# Patient Record
Sex: Female | Born: 1960 | Race: White | Hispanic: No | Marital: Married | Smoking: Never smoker
Health system: Southern US, Community
[De-identification: ages and names within clinical notes are randomized; demographics above are authoritative.]

## PROBLEM LIST (undated history)

## (undated) DIAGNOSIS — N926 Irregular menstruation, unspecified: Secondary | ICD-10-CM

## (undated) DIAGNOSIS — R51 Headache: Secondary | ICD-10-CM

## (undated) DIAGNOSIS — T7840XA Allergy, unspecified, initial encounter: Secondary | ICD-10-CM

## (undated) DIAGNOSIS — R519 Headache, unspecified: Secondary | ICD-10-CM

## (undated) DIAGNOSIS — F419 Anxiety disorder, unspecified: Secondary | ICD-10-CM

## (undated) HISTORY — PX: OTHER SURGICAL HISTORY: SHX169

---

## 2004-11-24 ENCOUNTER — Ambulatory Visit: Payer: Self-pay

## 2004-11-28 ENCOUNTER — Emergency Department: Payer: Self-pay | Admitting: Internal Medicine

## 2005-11-12 ENCOUNTER — Ambulatory Visit: Payer: Self-pay | Admitting: Obstetrics and Gynecology

## 2005-12-16 ENCOUNTER — Ambulatory Visit: Payer: Self-pay

## 2006-12-19 ENCOUNTER — Ambulatory Visit: Payer: Self-pay

## 2007-12-21 ENCOUNTER — Ambulatory Visit: Payer: Self-pay

## 2010-05-11 ENCOUNTER — Ambulatory Visit: Payer: Self-pay

## 2012-02-16 ENCOUNTER — Ambulatory Visit: Payer: Self-pay | Admitting: Family Medicine

## 2018-03-15 ENCOUNTER — Other Ambulatory Visit: Payer: Self-pay | Admitting: Student

## 2018-03-15 DIAGNOSIS — Z1231 Encounter for screening mammogram for malignant neoplasm of breast: Secondary | ICD-10-CM

## 2018-05-12 ENCOUNTER — Encounter: Payer: Self-pay | Admitting: *Deleted

## 2018-05-15 ENCOUNTER — Ambulatory Visit: Payer: PRIVATE HEALTH INSURANCE | Admitting: Anesthesiology

## 2018-05-15 ENCOUNTER — Encounter
Admission: RE | Disposition: A | Discharge status: Home / Self Care | Payer: Self-pay | Source: Ambulatory Visit | Attending: Unknown Physician Specialty

## 2018-05-15 ENCOUNTER — Ambulatory Visit
Admission: RE | Admit: 2018-05-15 | Discharge: 2018-05-15 | Disposition: A | Discharge status: Home / Self Care | Payer: PRIVATE HEALTH INSURANCE | Source: Ambulatory Visit | Attending: Unknown Physician Specialty | Admitting: Unknown Physician Specialty

## 2018-05-15 DIAGNOSIS — Z8042 Family history of malignant neoplasm of prostate: Secondary | ICD-10-CM | POA: Insufficient documentation

## 2018-05-15 DIAGNOSIS — Z8249 Family history of ischemic heart disease and other diseases of the circulatory system: Secondary | ICD-10-CM | POA: Diagnosis not present

## 2018-05-15 DIAGNOSIS — Z1211 Encounter for screening for malignant neoplasm of colon: Secondary | ICD-10-CM | POA: Diagnosis not present

## 2018-05-15 DIAGNOSIS — K640 First degree hemorrhoids: Secondary | ICD-10-CM | POA: Diagnosis not present

## 2018-05-15 DIAGNOSIS — R51 Headache: Secondary | ICD-10-CM | POA: Insufficient documentation

## 2018-05-15 DIAGNOSIS — D123 Benign neoplasm of transverse colon: Secondary | ICD-10-CM | POA: Insufficient documentation

## 2018-05-15 DIAGNOSIS — Z833 Family history of diabetes mellitus: Secondary | ICD-10-CM | POA: Insufficient documentation

## 2018-05-15 DIAGNOSIS — F419 Anxiety disorder, unspecified: Secondary | ICD-10-CM | POA: Diagnosis not present

## 2018-05-15 HISTORY — DX: Headache: R51

## 2018-05-15 HISTORY — DX: Irregular menstruation, unspecified: N92.6

## 2018-05-15 HISTORY — PX: COLONOSCOPY WITH PROPOFOL: SHX5780

## 2018-05-15 HISTORY — DX: Anxiety disorder, unspecified: F41.9

## 2018-05-15 HISTORY — DX: Allergy, unspecified, initial encounter: T78.40XA

## 2018-05-15 HISTORY — DX: Headache, unspecified: R51.9

## 2018-05-15 SURGERY — COLONOSCOPY WITH PROPOFOL
Anesthesia: General

## 2018-05-15 MED ORDER — SODIUM CHLORIDE 0.9 % IV SOLN: INTRAVENOUS | Status: DC

## 2018-05-15 MED ORDER — MIDAZOLAM HCL 2 MG/2ML IJ SOLN
INTRAMUSCULAR | Status: DC | PRN
  Administered 2018-05-15: 2 mg via INTRAVENOUS

## 2018-05-15 MED ORDER — MIDAZOLAM HCL 2 MG/2ML IJ SOLN
INTRAMUSCULAR | Status: AC
  Filled 2018-05-15: qty 2

## 2018-05-15 MED ORDER — FENTANYL CITRATE (PF) 100 MCG/2ML IJ SOLN
INTRAMUSCULAR | Status: AC
  Filled 2018-05-15: qty 2

## 2018-05-15 MED ORDER — FENTANYL CITRATE (PF) 100 MCG/2ML IJ SOLN
INTRAMUSCULAR | Status: DC | PRN
  Administered 2018-05-15: 50 ug via INTRAVENOUS

## 2018-05-15 MED ORDER — SODIUM CHLORIDE 0.9 % IV SOLN
INTRAVENOUS | Status: DC
  Administered 2018-05-15: 13:00:00 via INTRAVENOUS

## 2018-05-15 MED ORDER — PROPOFOL 500 MG/50ML IV EMUL
INTRAVENOUS | Status: AC
  Filled 2018-05-15: qty 50

## 2018-05-15 MED ORDER — PROPOFOL 500 MG/50ML IV EMUL
INTRAVENOUS | Status: DC | PRN
  Administered 2018-05-15: 120 ug/kg/min via INTRAVENOUS

## 2018-05-15 NOTE — Anesthesia Post-op Follow-up Note (Signed)
Anesthesia QCDR form completed.        

## 2018-05-15 NOTE — Anesthesia Procedure Notes (Signed)
Performed by: Cook-Martin, Aviyanna Colbaugh Pre-anesthesia Checklist: Patient identified, Emergency Drugs available, Suction available and Patient being monitored Patient Re-evaluated:Patient Re-evaluated prior to induction Oxygen Delivery Method: Nasal cannula Preoxygenation: Pre-oxygenation with 100% oxygen Induction Type: IV induction Placement Confirmation: positive ETCO2 and CO2 detector       

## 2018-05-15 NOTE — Transfer of Care (Signed)
Immediate Anesthesia Transfer of Care Note  Patient: Claudia Fritz  Procedure(s) Performed: COLONOSCOPY WITH PROPOFOL (N/A )  Patient Location: Endoscopy Unit  Anesthesia Type:General  Level of Consciousness: awake and alert   Airway & Oxygen Therapy: Patient connected to nasal cannula oxygen  Post-op Assessment: Post -op Vital signs reviewed and stable  Post vital signs: stable  Last Vitals:  Vitals Value Taken Time  BP    Temp    Pulse    Resp    SpO2      Last Pain:  Vitals:   05/15/18 1518  TempSrc: (P) Tympanic  PainSc:          Complications: No apparent anesthesia complications

## 2018-05-15 NOTE — H&P (Signed)
Primary Care Physician:  Ellene Route Primary Gastroenterologist:  Dr. Vira Agar  Pre-Procedure History & Physical: HPI:  Claudia Fritz is a 57 y.o. female is here for an colonoscopy.   Past Medical History:  Diagnosis Date  . Allergy   . Anxiety   . Headache   . Menstrual problem     Past Surgical History:  Procedure Laterality Date  . Graft cartilage nasal septum    . Hysteroscopy with endometrial ablation      Prior to Admission medications   Medication Sig Start Date End Date Taking? Authorizing Provider  Multiple Vitamins-Minerals (WOMENS MULTIVITAMIN PO) Take by mouth daily.   Yes [provider]    Allergies as of 05/12/2018  . (Not on File)    Family History  Problem Relation Age of Onset  . Hyperlipidemia Mother   . Hypertension Mother   . Hyperlipidemia Father   . Hypertension Father   . Diabetes Maternal Grandmother   . Hyperlipidemia Maternal Grandmother   . Heart attack Maternal Grandfather   . Sudden Cardiac Death Maternal Grandfather   . Prostate cancer Paternal Grandfather     Social History   Socioeconomic History  . Marital status: Married    Spouse name: Not on file  . Number of children: Not on file  . Years of education: Not on file  . Highest education level: Not on file  Occupational History  . Not on file  Social Needs  . Financial resource strain: Not on file  . Food insecurity:    Worry: Not on file    Inability: Not on file  . Transportation needs:    Medical: Not on file    Non-medical: Not on file  Tobacco Use  . Smoking status: Never Smoker  . Smokeless tobacco: Never Used  Substance and Sexual Activity  . Alcohol use: Yes    Comment: 2-3 times a week,none last 24hrs  . Drug use: Never  . Sexual activity: Yes  Lifestyle  . Physical activity:    Days per week: Not on file    Minutes per session: Not on file  . Stress: Not on file  Relationships  . Social connections:    Talks on phone: Not on  file    Gets together: Not on file    Attends religious service: Not on file    Active member of club or organization: Not on file    Attends meetings of clubs or organizations: Not on file    Relationship status: Not on file  . Intimate partner violence:    Fear of current or ex partner: Not on file    Emotionally abused: Not on file    Physically abused: Not on file    Forced sexual activity: Not on file  Other Topics Concern  . Not on file  Social History Narrative  . Not on file    Review of Systems: See HPI, otherwise negative ROS  Physical Exam: BP (!) 107/58   Pulse 89   Temp (!) 97 F (36.1 C) (Tympanic)   Resp 15   Ht 5\' 5"  (1.651 m)   Wt 72.6 kg   SpO2 97%   BMI 26.63 kg/m  General:   Alert,  pleasant and cooperative in NAD Head:  Normocephalic and atraumatic. Neck:  Supple; no masses or thyromegaly. Lungs:  Clear throughout to auscultation.    Heart:  Regular rate and rhythm. Abdomen:  Soft, nontender and nondistended. Normal bowel sounds, without guarding, and  without rebound.   Neurologic:  Alert and  oriented x4;  grossly normal neurologically.  Impression/Plan: Claudia Fritz is here for an colonoscopy to be performed for colon cancer screening.  Risks, benefits, limitations, and alternatives regarding  colonoscopy have been reviewed with the patient.  Questions have been answered.  All parties agreeable.   Gaylyn Cheers, MD  05/15/2018, 3:23 PM

## 2018-05-15 NOTE — Anesthesia Preprocedure Evaluation (Addendum)
Anesthesia Evaluation  Patient identified by MRN, date of birth, ID band Patient awake    Reviewed: Allergy & Precautions, NPO status , Patient's Chart, lab work & pertinent test results  History of Anesthesia Complications Negative for: history of anesthetic complications  Airway Mallampati: II       Dental   Pulmonary neg sleep apnea, neg COPD,           Cardiovascular (-) hypertension(-) Past MI and (-) CHF (-) dysrhythmias (-) Valvular Problems/Murmurs     Neuro/Psych neg Seizures Anxiety    GI/Hepatic Neg liver ROS, neg GERD  ,  Endo/Other  neg diabetes  Renal/GU negative Renal ROS     Musculoskeletal   Abdominal   Peds  Hematology   Anesthesia Other Findings   Reproductive/Obstetrics                            Anesthesia Physical Anesthesia Plan  ASA: II  Anesthesia Plan: General   Post-op Pain Management:    Induction:   PONV Risk Score and Plan: 3 and TIVA and Propofol infusion  Airway Management Planned: Nasal Cannula  Additional Equipment:   Intra-op Plan:   Post-operative Plan:   Informed Consent: I have reviewed the patients History and Physical, chart, labs and discussed the procedure including the risks, benefits and alternatives for the proposed anesthesia with the patient or authorized representative who has indicated his/her understanding and acceptance.     Plan Discussed with:   Anesthesia Plan Comments:         Anesthesia Quick Evaluation

## 2018-05-15 NOTE — Op Note (Signed)
[facility] Gastroenterology Patient Name: Claudia Fritz Procedure Date: 05/15/2018 2:44 PM MRN: [id number] Account #: [id number] Date of Birth: [birth year] Admit Type: Outpatient Age: 57 Room: [facility] ENDO ROOM 1 Gender: Female Note Status: Finalized Procedure:            Colonoscopy Indications:          Screening for colorectal malignant neoplasm Providers:            Manya Silvas, MD Referring MD:         No Local Md, MD (Referring MD) Medicines:            Propofol per Anesthesia Complications:        No immediate complications. Procedure:            Pre-Anesthesia Assessment:                       - After reviewing the risks and benefits, the patient                        was deemed in satisfactory condition to undergo the                        procedure.                       After obtaining informed consent, the colonoscope was                        passed under direct vision. Throughout the procedure,                        the patient's blood pressure, pulse, and oxygen                        saturations were monitored continuously. The                        Colonoscope was introduced through the anus and                        advanced to the the cecum, identified by appendiceal                        orifice and ileocecal valve. The colonoscopy was                        performed without difficulty. The patient tolerated the                        procedure well. The quality of the bowel preparation                        was excellent. Findings:      A diminutive polyp was found in the hepatic flexure. The polyp was       sessile. The polyp was removed with a jumbo cold forceps. Resection and       retrieval were complete.      A diminutive polyp was found in the transverse colon. The polyp was       sessile. The polyp was removed with a jumbo cold forceps. Resection and  retrieval were complete.      Internal hemorrhoids were found  during endoscopy. The hemorrhoids were       small and Grade I (internal hemorrhoids that do not prolapse).      The exam was otherwise without abnormality. Impression:           - One diminutive polyp at the hepatic flexure, removed                        with a jumbo cold forceps. Resected and retrieved.                       - One diminutive polyp in the transverse colon, removed                        with a jumbo cold forceps. Resected and retrieved.                       - Internal hemorrhoids.                       - The examination was otherwise normal. Recommendation:       - Await pathology results. Manya Silvas, MD 05/15/2018 3:20:31 PM This report has been signed electronically. Number of Addenda: 0 Note Initiated On: 05/15/2018 2:44 PM Scope Withdrawal Time: 0 hours 15 minutes 34 seconds  Total Procedure Duration: 0 hours 21 minutes 41 seconds       [facility]

## 2018-05-15 NOTE — Anesthesia Postprocedure Evaluation (Signed)
Anesthesia Post Note  Patient: Claudia Fritz  Procedure(s) Performed: COLONOSCOPY WITH PROPOFOL (N/A )  Patient location during evaluation: Endoscopy Anesthesia Type: General Level of consciousness: awake and alert Pain management: pain level controlled Vital Signs Assessment: post-procedure vital signs reviewed and stable Respiratory status: spontaneous breathing and respiratory function stable Cardiovascular status: stable Anesthetic complications: no     Last Vitals:  Vitals:   05/15/18 1320 05/15/18 1518  BP: (!) 157/90 (!) 107/58  Pulse: 89   Resp: 18 15  Temp: (!) 36.1 C (!) 36.1 C  SpO2: 100% 97%    Last Pain:  Vitals:   05/15/18 1518  TempSrc: Tympanic  PainSc: 0-No pain                 KEPHART,WILLIAM K

## 2018-05-16 ENCOUNTER — Encounter: Payer: Self-pay | Admitting: Unknown Physician Specialty

## 2018-05-18 LAB — SURGICAL PATHOLOGY

## 2019-05-24 ENCOUNTER — Other Ambulatory Visit: Payer: Self-pay | Admitting: Student

## 2019-05-24 DIAGNOSIS — Z1231 Encounter for screening mammogram for malignant neoplasm of breast: Secondary | ICD-10-CM

## 2019-08-29 ENCOUNTER — Ambulatory Visit
Admission: RE | Admit: 2019-08-29 | Discharge: 2019-08-29 | Disposition: A | Discharge status: Home / Self Care | Payer: PRIVATE HEALTH INSURANCE | Source: Ambulatory Visit | Attending: Student | Admitting: Student

## 2019-08-29 DIAGNOSIS — Z1231 Encounter for screening mammogram for malignant neoplasm of breast: Secondary | ICD-10-CM

## 2019-08-31 ENCOUNTER — Other Ambulatory Visit: Payer: Self-pay | Admitting: Student

## 2019-08-31 DIAGNOSIS — N6310 Unspecified lump in the right breast, unspecified quadrant: Secondary | ICD-10-CM

## 2019-08-31 DIAGNOSIS — R928 Other abnormal and inconclusive findings on diagnostic imaging of breast: Secondary | ICD-10-CM

## 2019-08-31 DIAGNOSIS — N6320 Unspecified lump in the left breast, unspecified quadrant: Secondary | ICD-10-CM

## 2019-09-07 ENCOUNTER — Ambulatory Visit
Admission: RE | Admit: 2019-09-07 | Discharge: 2019-09-07 | Disposition: A | Discharge status: Home / Self Care | Payer: PRIVATE HEALTH INSURANCE | Source: Ambulatory Visit | Attending: Student | Admitting: Student

## 2019-09-07 DIAGNOSIS — R928 Other abnormal and inconclusive findings on diagnostic imaging of breast: Secondary | ICD-10-CM

## 2019-09-07 DIAGNOSIS — N6320 Unspecified lump in the left breast, unspecified quadrant: Secondary | ICD-10-CM

## 2019-09-07 DIAGNOSIS — N6310 Unspecified lump in the right breast, unspecified quadrant: Secondary | ICD-10-CM

## 2019-09-10 ENCOUNTER — Other Ambulatory Visit: Payer: Self-pay | Admitting: Student

## 2019-09-10 DIAGNOSIS — N6310 Unspecified lump in the right breast, unspecified quadrant: Secondary | ICD-10-CM

## 2019-09-12 ENCOUNTER — Other Ambulatory Visit: Payer: PRIVATE HEALTH INSURANCE

## 2019-09-12 ENCOUNTER — Ambulatory Visit: Payer: PRIVATE HEALTH INSURANCE

## 2019-09-18 ENCOUNTER — Other Ambulatory Visit: Payer: Self-pay | Admitting: Student

## 2019-09-18 DIAGNOSIS — N6310 Unspecified lump in the right breast, unspecified quadrant: Secondary | ICD-10-CM

## 2019-09-18 DIAGNOSIS — R928 Other abnormal and inconclusive findings on diagnostic imaging of breast: Secondary | ICD-10-CM

## 2019-09-25 ENCOUNTER — Ambulatory Visit
Admission: RE | Admit: 2019-09-25 | Discharge: 2019-09-25 | Disposition: A | Discharge status: Home / Self Care | Payer: PRIVATE HEALTH INSURANCE | Source: Ambulatory Visit | Attending: Student | Admitting: Student

## 2019-09-25 DIAGNOSIS — R928 Other abnormal and inconclusive findings on diagnostic imaging of breast: Secondary | ICD-10-CM | POA: Diagnosis present

## 2019-09-25 DIAGNOSIS — N6310 Unspecified lump in the right breast, unspecified quadrant: Secondary | ICD-10-CM | POA: Insufficient documentation

## 2020-09-20 IMAGING — MG DIGITAL SCREENING BILAT W/ TOMO W/ CAD
8 series · 8 of 24 positions shown · non-contrast
Comparison: Previous exam(s).

CLINICAL DATA: Screening.

EXAM:
DIGITAL SCREENING BILATERAL MAMMOGRAM WITH TOMO AND CAD

[R CC synth-2D]
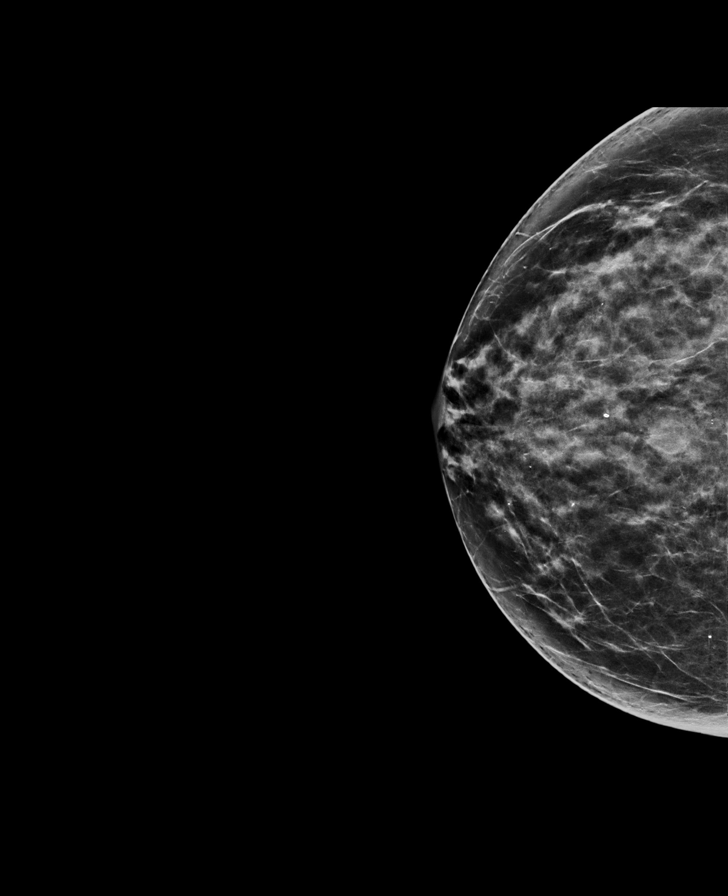

[L MLO synth-2D]
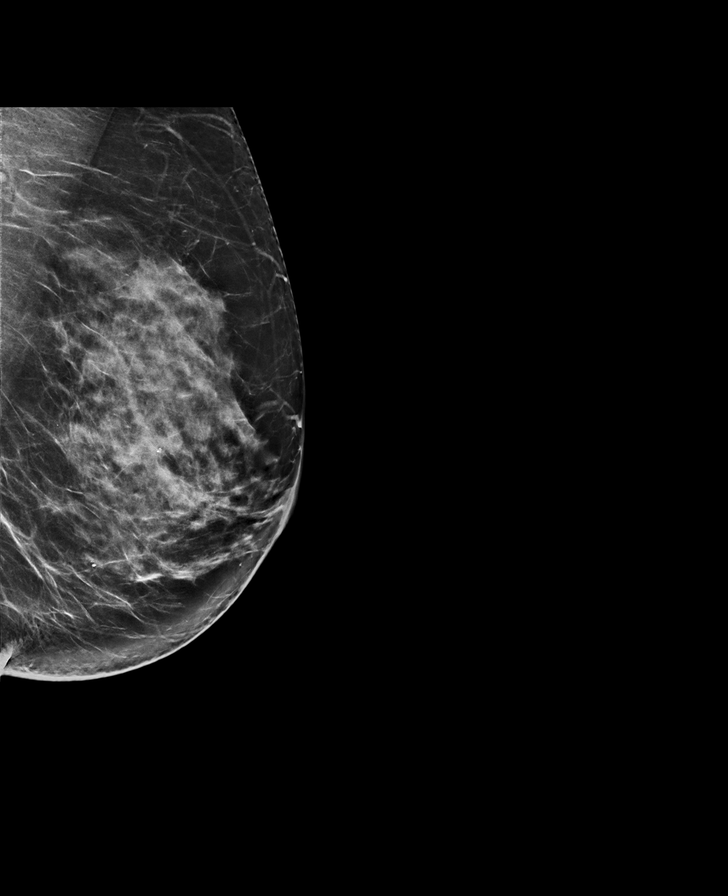

[R MLO synth-2D]
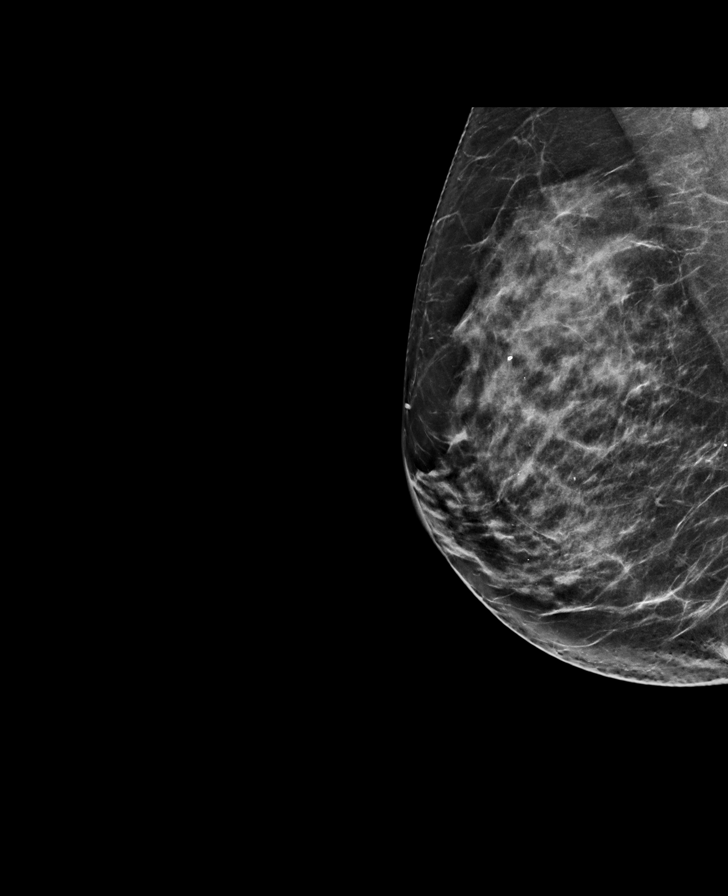

[L CC synth-2D]
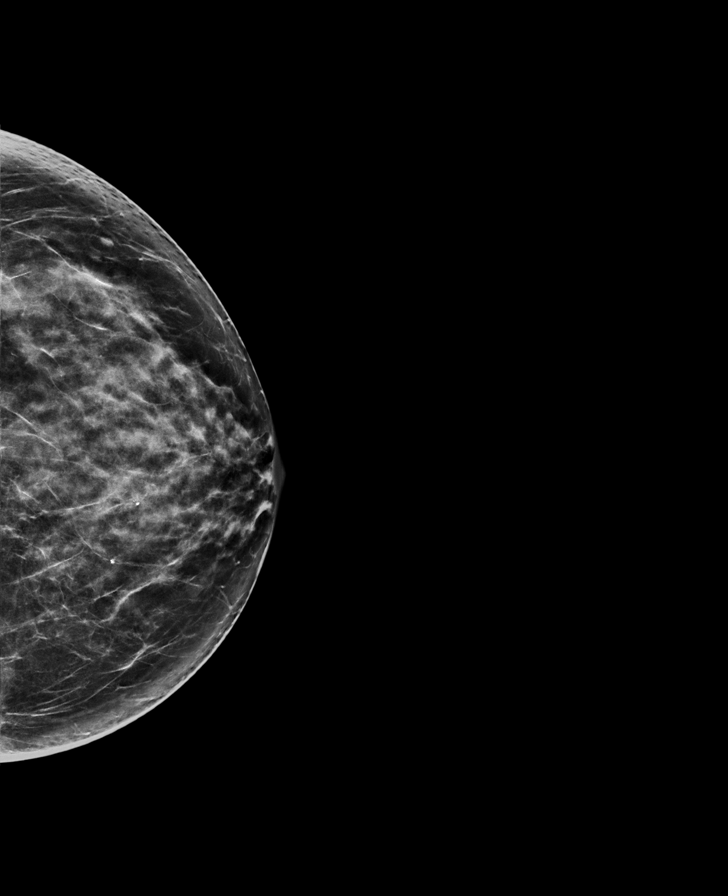

[R CC tomo · tomo slice 37/74.0]
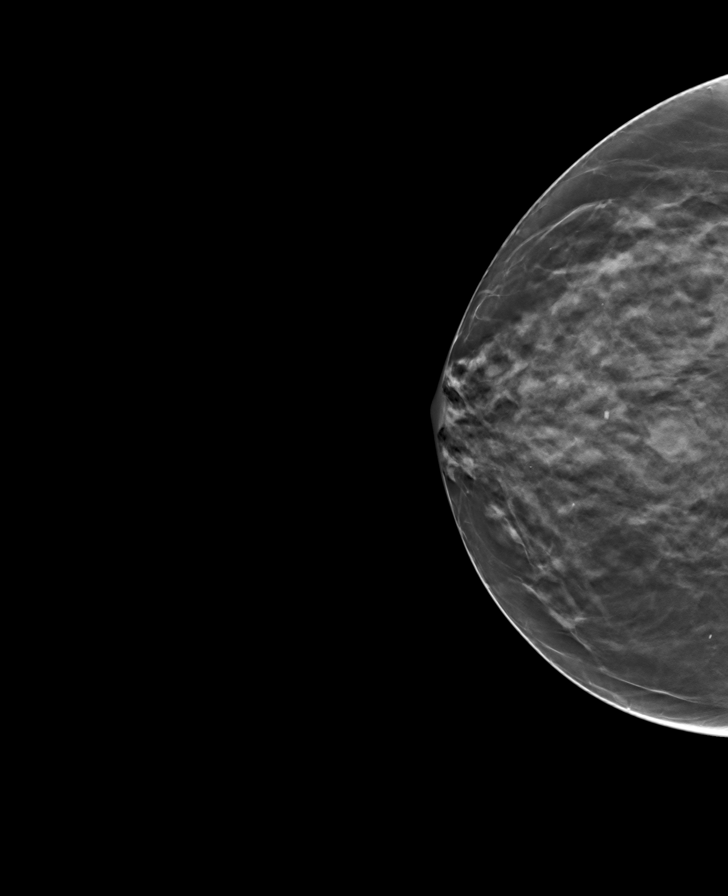

[L MLO tomo · tomo slice 40/79.0]
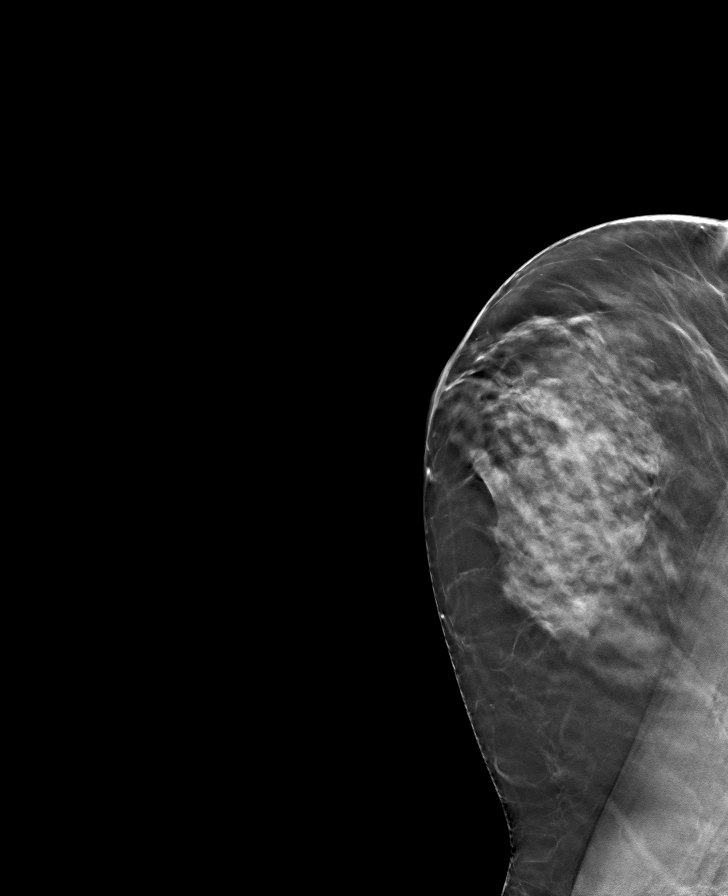

[R MLO tomo · tomo slice 39/77.0]
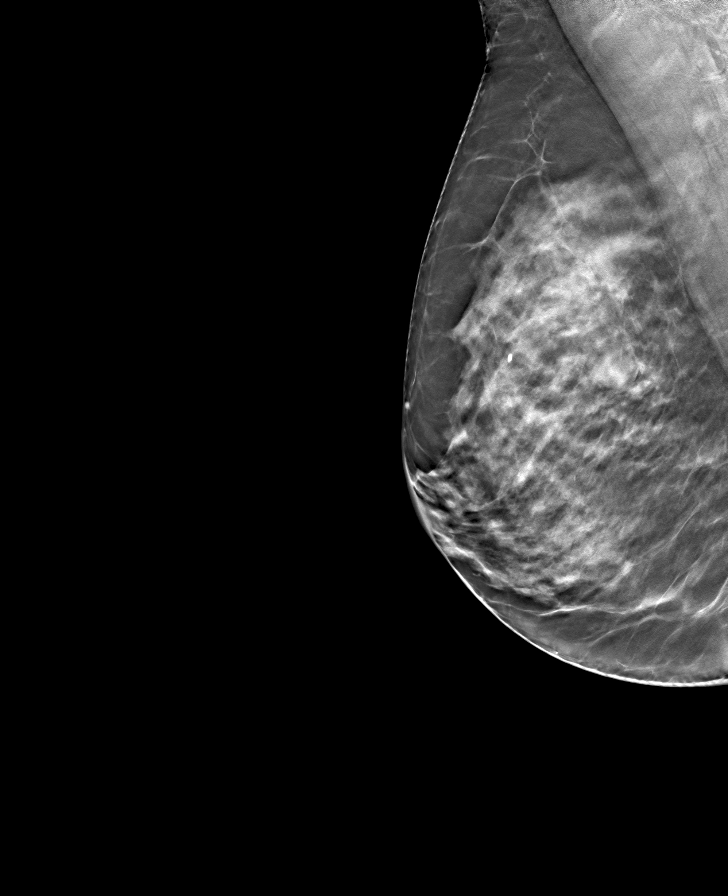

[L CC tomo · tomo slice 37/73.0]
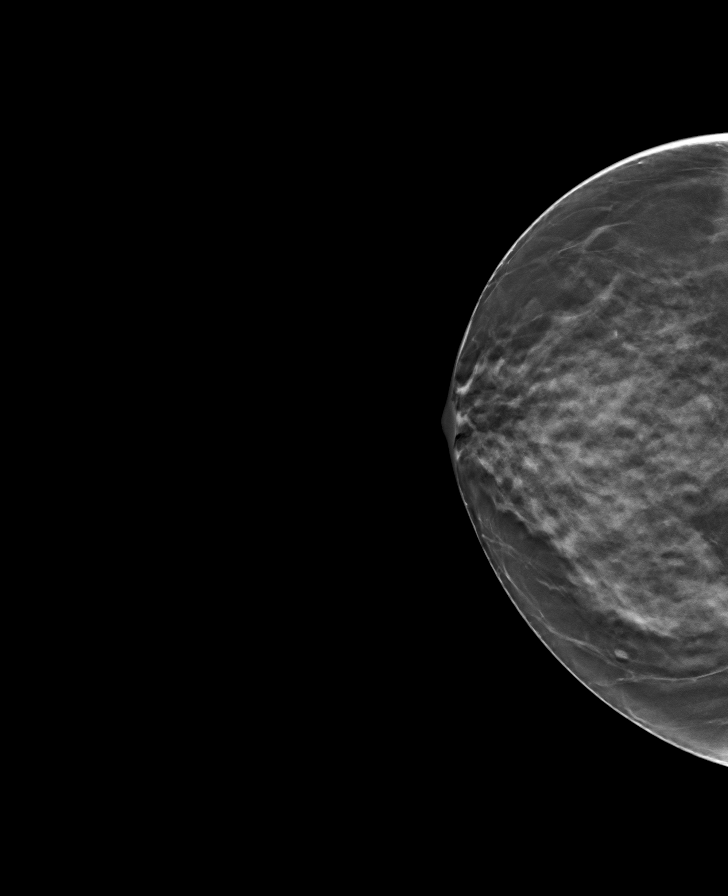

[8 of 24 positions shown; findings below may reference images not displayed]

ACR Breast Density Category c: The breast tissue is heterogeneously
dense, which may obscure small masses.
FINDINGS: In the right breast a possible mass requires further evaluation.

In the left breast a possible mass requires further evaluation.

Images were processed with CAD.
IMPRESSION: Further evaluation is suggested for possible mass in the right
breast.

Further evaluation is suggested for possible mass in the left
breast.

RECOMMENDATION:
Diagnostic mammogram and possibly ultrasound of both breasts.
(Code:[id number])

The patient will be contacted regarding the findings, and additional
imaging will be scheduled.

BI-RADS CATEGORY  0: Incomplete. Need additional imaging evaluation
and/or prior mammograms for comparison.

## 2020-09-29 IMAGING — US US BREAST*R* LIMITED INC AXILLA
1 series · 6 of 6 positions shown · non-contrast
Comparison: Previous exam(s).

CLINICAL DATA: Patient was called back for possible masses in both
breast.

EXAM:
DIGITAL DIAGNOSTIC BILATERAL MAMMOGRAM WITH CAD AND TOMO
ULTRASOUND RIGHT BREAST

[Series 1: us breast*right* limited inc axilla · 0.09mm/px · 6 of 6 slices shown]
[im 1/6]
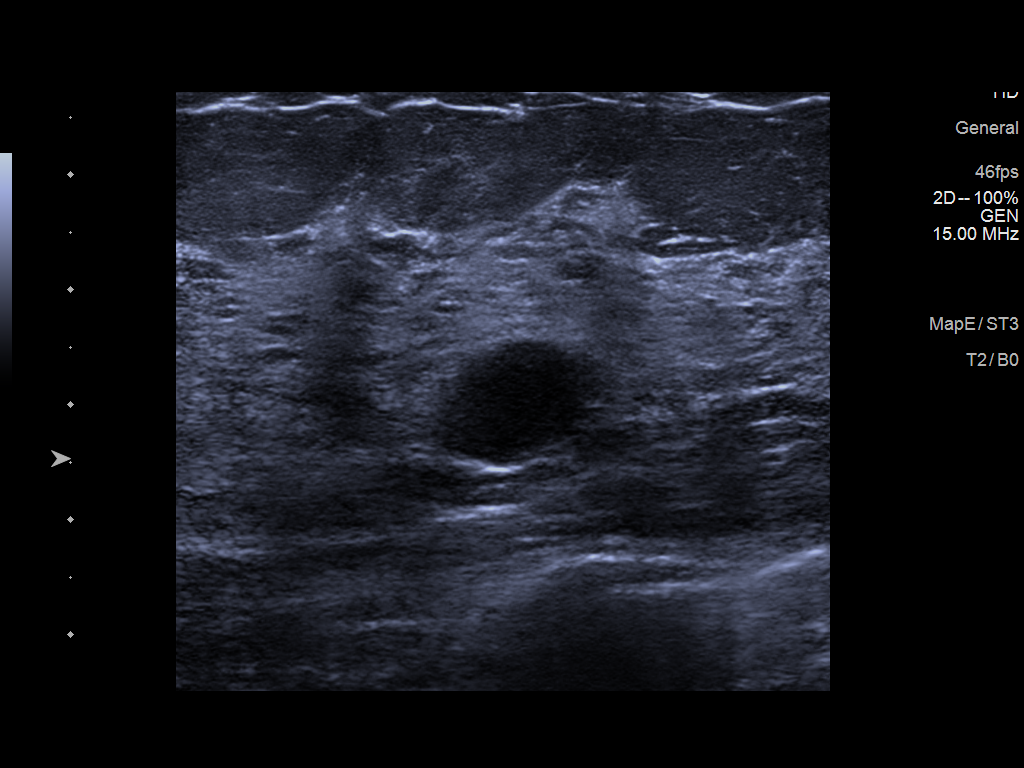
[im 2/6]
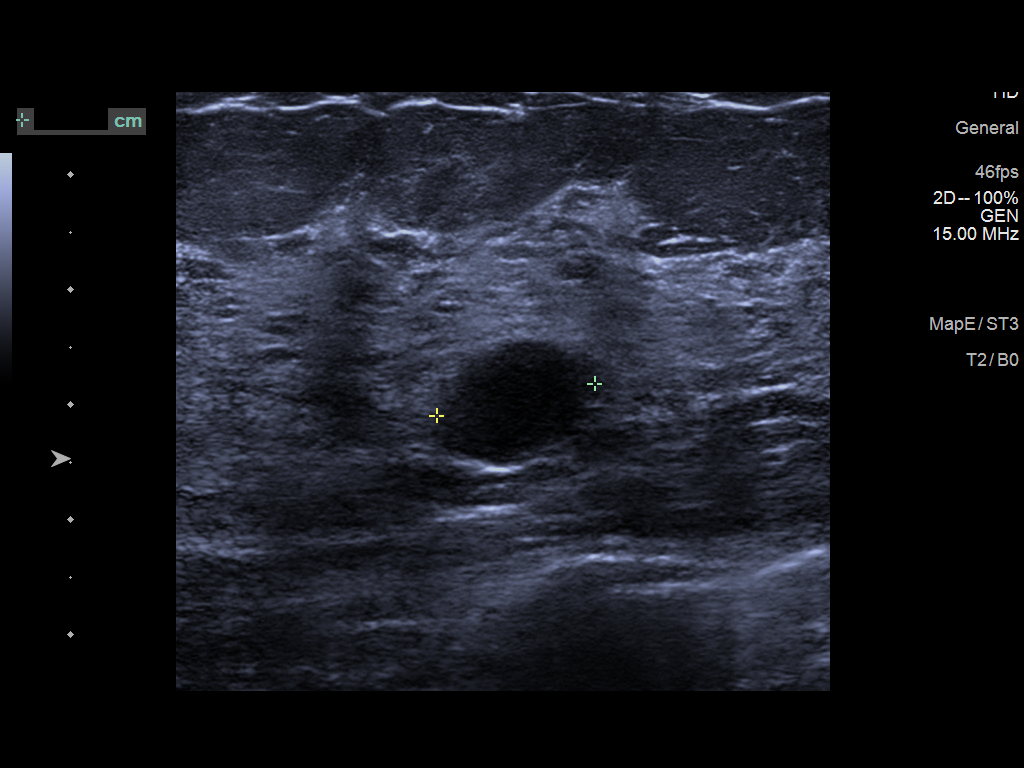
[im 3/6]
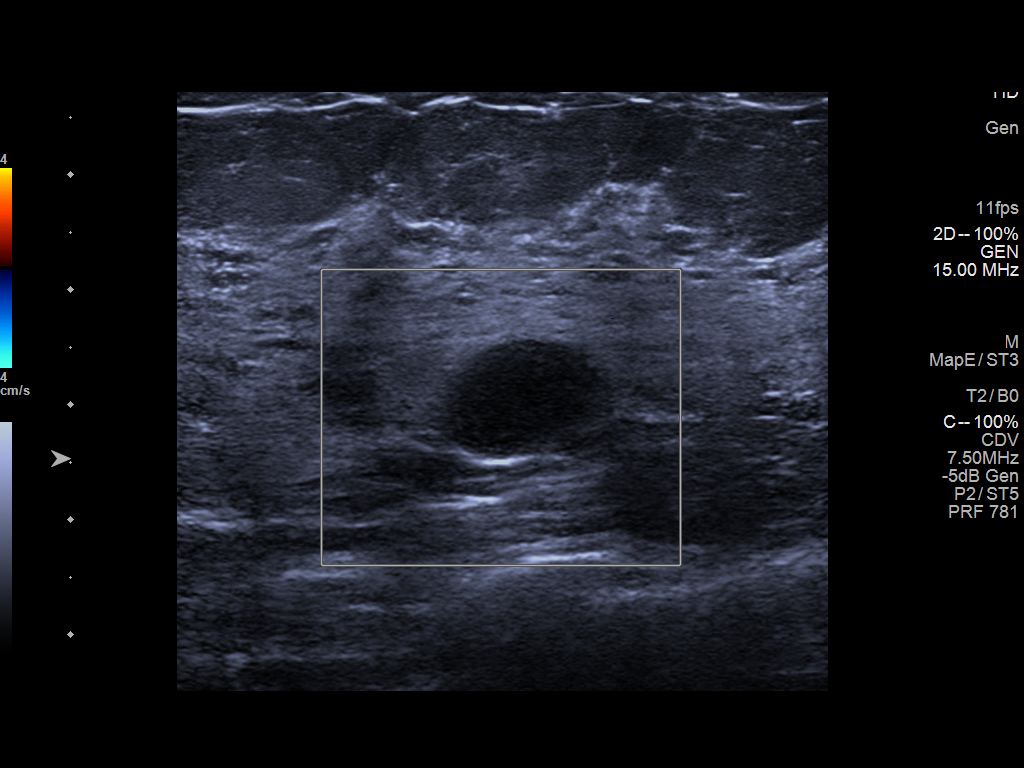
[im 4/6]
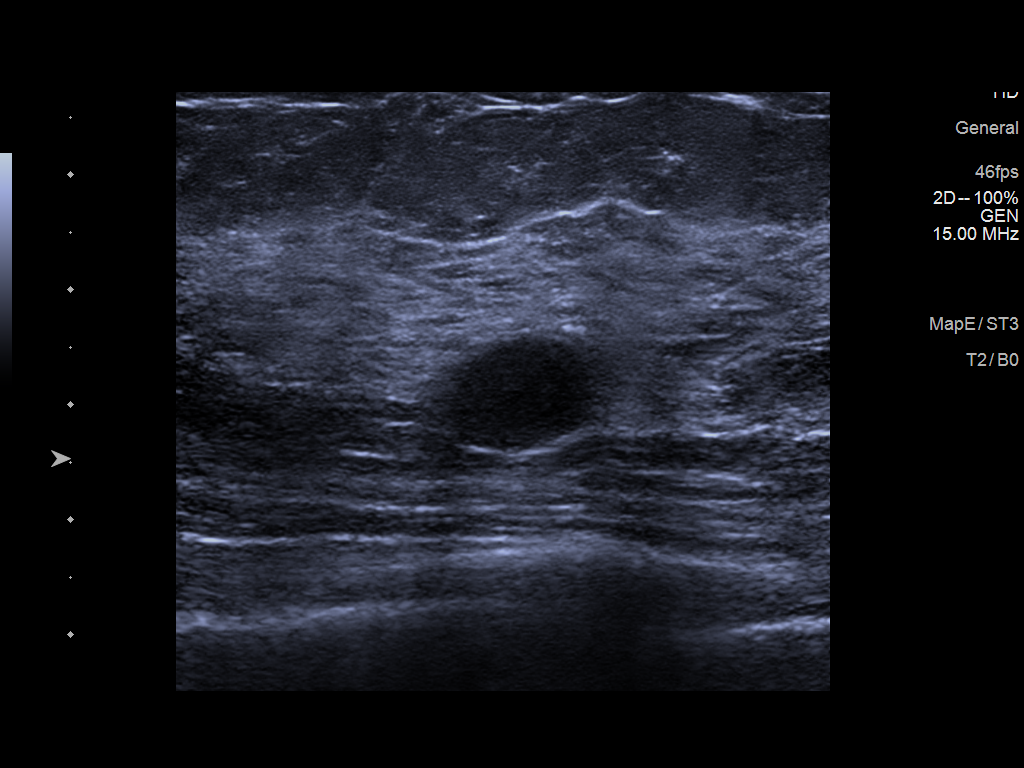
[im 5/6]
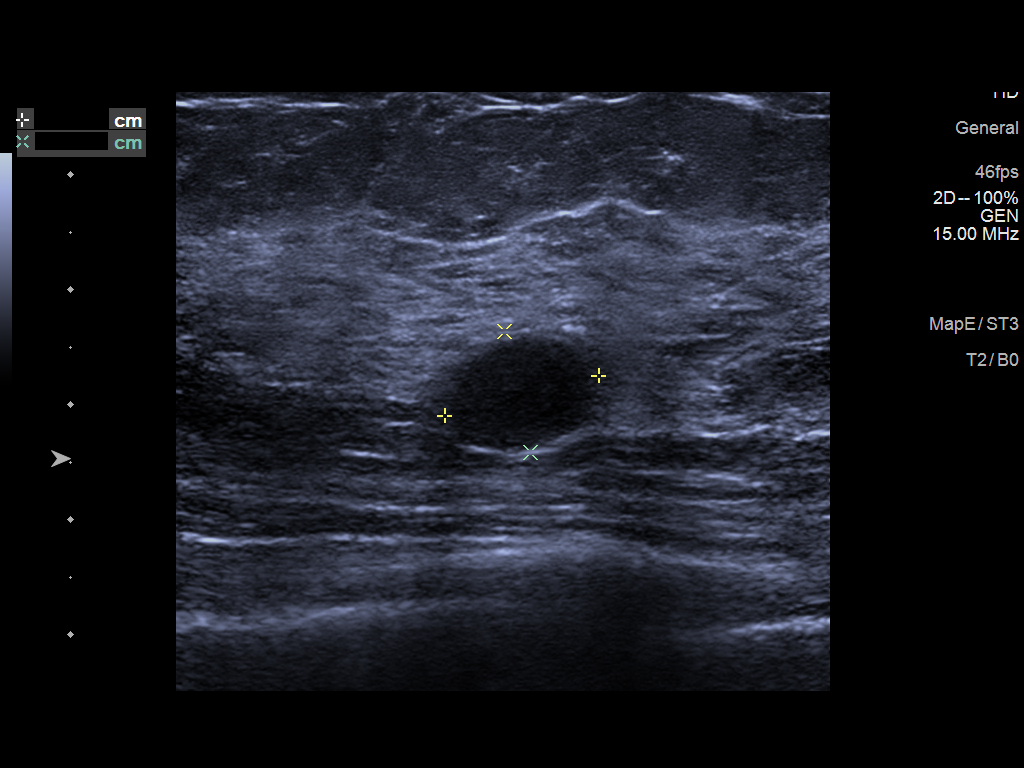
[im 6/6]
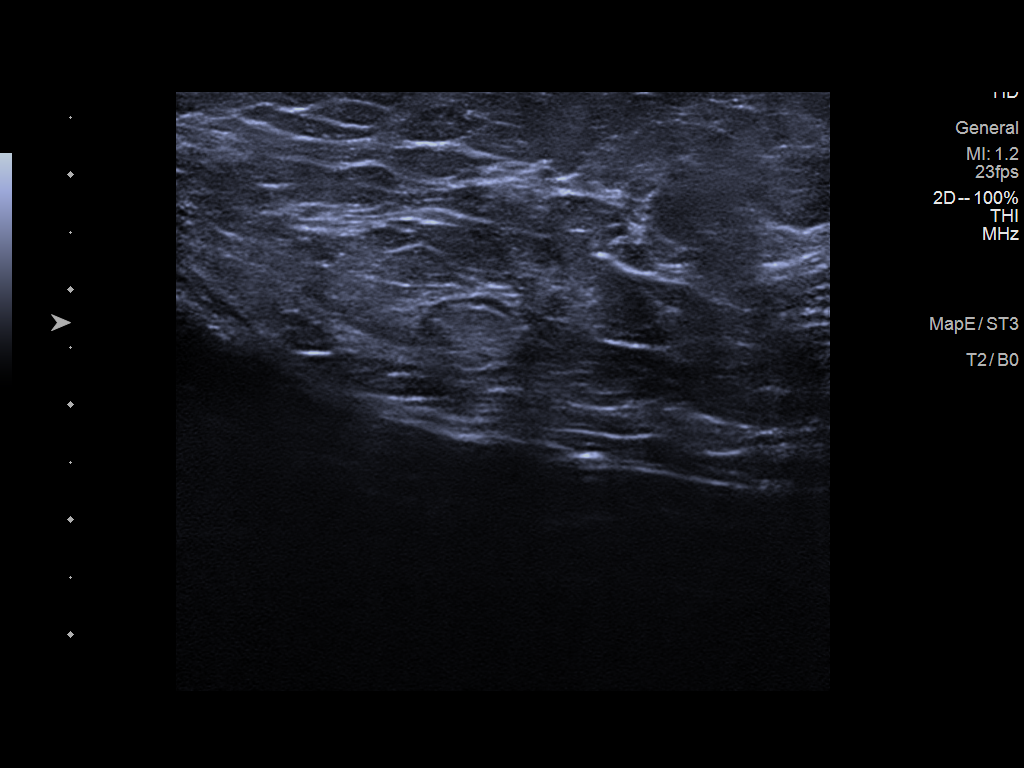

[6 of 6 positions shown; findings below may reference images not displayed]

ACR Breast Density Category c: The breast tissue is heterogeneously
dense, which may obscure small masses.
FINDINGS: Additional imaging of both breast was performed.

No suspicious mass, malignant type microcalcifications or distortion
detected in the left breast.

In the right breast there is persistence of a circumscribed mass in
the upper slightly inner quadrant of the breast. There are no
malignant type microcalcifications.

Mammographic images were processed with CAD.

On physical exam, I do not palpate a mass in the superior aspect of
the right breast.

Targeted ultrasound is performed, showing a hypoechoic mass with
increased through transmission and no internal blood flow in the
right breast at [DATE] 2 cm from the nipple measuring 1.4 x 1.1 x
cm. Sonographic evaluation the right axilla does not show any
enlarged adenopathy.
IMPRESSION: Probable benign complex cyst in the [DATE] region of the right
breast. No evidence of malignancy in the left breast.

RECOMMENDATION:
Short-term interval follow-up right breast ultrasound in 6 months is
recommended.

I have discussed the findings and recommendations with the patient.
If applicable, a reminder letter will be sent to the patient
regarding the next appointment.

BI-RADS CATEGORY  3: Probably benign.

## 2020-10-17 IMAGING — MG US ASPIRATION RIGHT BREAST
1 series · 1 of 1 positions shown · non-contrast
Comparison: Previous exams.

CLINICAL DATA: 58-year-old female presenting for aspiration of a
right breast cyst.

EXAM:
ULTRASOUND GUIDED RIGHT BREAST CYST ASPIRATION

[MG view]
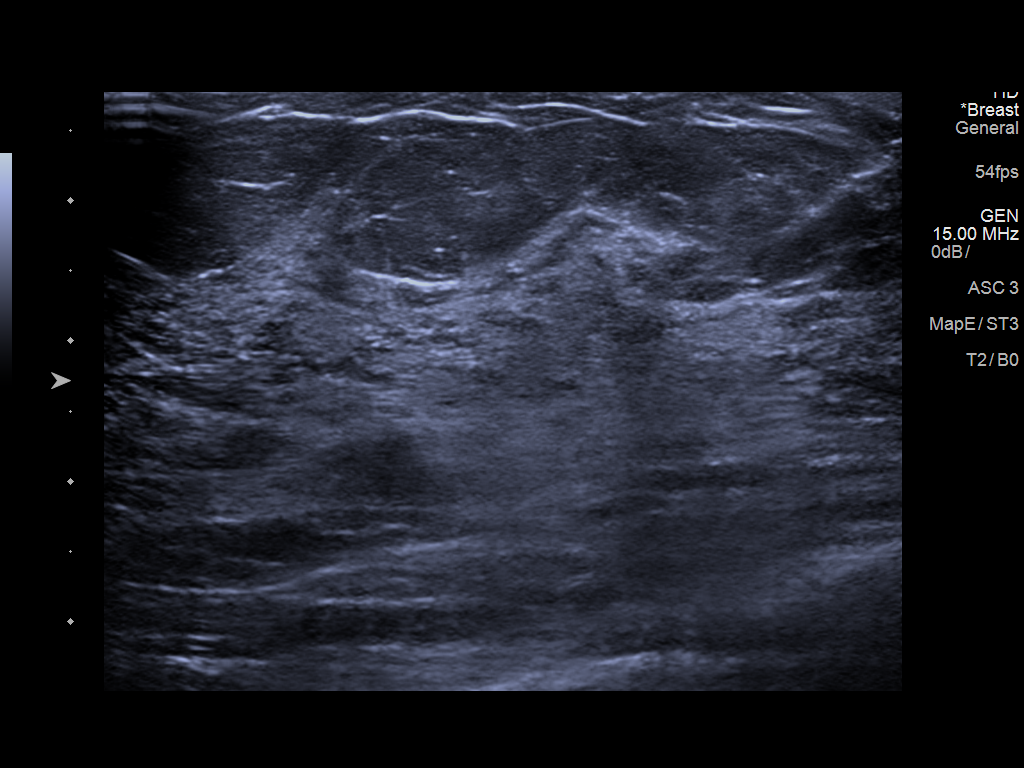

[1 of 1 positions shown; findings below may reference images not displayed]

PROCEDURE:
Using sterile technique, 1% lidocaine, under direct ultrasound
visualization, needle aspiration of a cyst at 11:30 o'clock in the
right breast was performed. A small amount of yellow-tinged fluid
was aspirated. The cyst completely resolved.
IMPRESSION: Ultrasound-guided aspiration of a right breast cyst. No apparent
complications.

RECOMMENDATIONS:
Return to annual screening mammography which will be due in [DATE]
